# Patient Record
Sex: Female | Born: 1970 | Race: White | Hispanic: No | Marital: Married | Smoking: Never smoker
Health system: Southern US, Community
[De-identification: ages and names within clinical notes are randomized; demographics above are authoritative.]

## PROBLEM LIST (undated history)

## (undated) DIAGNOSIS — F419 Anxiety disorder, unspecified: Secondary | ICD-10-CM

## (undated) DIAGNOSIS — E785 Hyperlipidemia, unspecified: Secondary | ICD-10-CM

## (undated) DIAGNOSIS — I10 Essential (primary) hypertension: Secondary | ICD-10-CM

## (undated) HISTORY — DX: Hyperlipidemia, unspecified: E78.5

## (undated) HISTORY — DX: Essential (primary) hypertension: I10

## (undated) HISTORY — DX: Anxiety disorder, unspecified: F41.9

## (undated) HISTORY — PX: WISDOM TOOTH EXTRACTION: SHX21

---

## 2014-09-06 ENCOUNTER — Other Ambulatory Visit: Payer: Self-pay

## 2014-09-06 DIAGNOSIS — Z1231 Encounter for screening mammogram for malignant neoplasm of breast: Secondary | ICD-10-CM

## 2014-09-14 ENCOUNTER — Ambulatory Visit
Admission: RE | Admit: 2014-09-14 | Discharge: 2014-09-14 | Disposition: A | Discharge status: Home / Self Care | Payer: BC Managed Care – PPO | Source: Ambulatory Visit

## 2014-09-14 DIAGNOSIS — Z1231 Encounter for screening mammogram for malignant neoplasm of breast: Secondary | ICD-10-CM

## 2014-09-30 ENCOUNTER — Other Ambulatory Visit: Payer: Self-pay | Admitting: Physician Assistant

## 2014-09-30 DIAGNOSIS — R928 Other abnormal and inconclusive findings on diagnostic imaging of breast: Secondary | ICD-10-CM

## 2014-10-05 ENCOUNTER — Other Ambulatory Visit: Payer: Self-pay | Admitting: Physician Assistant

## 2014-10-05 ENCOUNTER — Other Ambulatory Visit: Payer: Self-pay

## 2014-10-05 DIAGNOSIS — R928 Other abnormal and inconclusive findings on diagnostic imaging of breast: Secondary | ICD-10-CM

## 2014-10-11 ENCOUNTER — Ambulatory Visit
Admission: RE | Admit: 2014-10-11 | Discharge: 2014-10-11 | Disposition: A | Discharge status: Home / Self Care | Payer: BC Managed Care – PPO | Source: Ambulatory Visit | Attending: Physician Assistant | Admitting: Physician Assistant

## 2014-10-11 DIAGNOSIS — R928 Other abnormal and inconclusive findings on diagnostic imaging of breast: Secondary | ICD-10-CM

## 2015-01-07 ENCOUNTER — Other Ambulatory Visit: Payer: Self-pay | Admitting: Physician Assistant

## 2015-08-20 ENCOUNTER — Emergency Department (HOSPITAL_COMMUNITY)
Admission: EM | Admit: 2015-08-20 | Discharge: 2015-08-20 | Disposition: A | Discharge status: Home / Self Care | Payer: BLUE CROSS/BLUE SHIELD | Attending: Emergency Medicine | Admitting: Emergency Medicine

## 2015-08-20 ENCOUNTER — Encounter (HOSPITAL_COMMUNITY): Payer: Self-pay | Admitting: Emergency Medicine

## 2015-08-20 DIAGNOSIS — Y9289 Other specified places as the place of occurrence of the external cause: Secondary | ICD-10-CM | POA: Insufficient documentation

## 2015-08-20 DIAGNOSIS — Z23 Encounter for immunization: Secondary | ICD-10-CM | POA: Diagnosis not present

## 2015-08-20 DIAGNOSIS — T1502XA Foreign body in cornea, left eye, initial encounter: Secondary | ICD-10-CM | POA: Diagnosis present

## 2015-08-20 DIAGNOSIS — Y998 Other external cause status: Secondary | ICD-10-CM | POA: Diagnosis not present

## 2015-08-20 DIAGNOSIS — X58XXXA Exposure to other specified factors, initial encounter: Secondary | ICD-10-CM | POA: Insufficient documentation

## 2015-08-20 DIAGNOSIS — Y9389 Activity, other specified: Secondary | ICD-10-CM | POA: Diagnosis not present

## 2015-08-20 DIAGNOSIS — T1592XA Foreign body on external eye, part unspecified, left eye, initial encounter: Secondary | ICD-10-CM

## 2015-08-20 MED ORDER — POLYMYXIN B-TRIMETHOPRIM 10000-0.1 UNIT/ML-% OP SOLN
1.0000 [drp] | OPHTHALMIC | Status: DC
  Administered 2015-08-20: 1 [drp] via OPHTHALMIC
  Filled 2015-08-20: qty 10

## 2015-08-20 MED ORDER — TETRACAINE HCL 0.5 % OP SOLN
1.0000 [drp] | Freq: Once | OPHTHALMIC | Status: AC
  Administered 2015-08-20: 1 [drp] via OPHTHALMIC
  Filled 2015-08-20: qty 2

## 2015-08-20 MED ORDER — TETANUS-DIPHTH-ACELL PERTUSSIS 5-2.5-18.5 LF-MCG/0.5 IM SUSP
0.5000 mL | Freq: Once | INTRAMUSCULAR | Status: AC
  Administered 2015-08-20: 0.5 mL via INTRAMUSCULAR
  Filled 2015-08-20: qty 0.5

## 2015-08-20 MED ORDER — FLUORESCEIN SODIUM 1 MG OP STRP
1.0000 | ORAL_STRIP | Freq: Once | OPHTHALMIC | Status: AC
  Administered 2015-08-20: 1 via OPHTHALMIC
  Filled 2015-08-20: qty 1

## 2015-08-20 NOTE — ED Notes (Signed)
Pt. reports foreign object at left eye this evening , mild pain / no blurred vision or drainage .

## 2015-08-20 NOTE — ED Provider Notes (Signed)
CSN: [id number]     Arrival date & time 08/20/15  1919 History  This chart was scribed for non-physician provider Roxy Horseman, PA-C, working with Laurence Spates, MD by Phillis Haggis, ED Scribe. This patient was seen in room TR04C/TR04C and patient care was started at 8:24 PM.    Chief Complaint  Patient presents with  . Foreign Body in Eye   The history is provided by the patient. No language interpreter was used.  HPI Comments: Claudia Jones is a 44 y.o. female who presents to the Emergency Department complaining of foreign body in left eye onset earlier this evening. Pt states that she was working outside, picking up and burning brush. States that she went inside and began to notice discomfort in her eye and saw a "spot" on her eye. Reports worsening pain. She states that she has tried rubbing the eye to move the foreign body to no relief. Pt states that she was wearing her glasses at the time of the incident. Denies having an eye doctor, drainage or blurred vision.  Pt is not UTD on tdap.  History reviewed. No pertinent past medical history. History reviewed. No pertinent past surgical history. No family history on file. Social History  Substance Use Topics  . Smoking status: Never Smoker   . Smokeless tobacco: None  . Alcohol Use: No   OB History    No data available     Review of Systems  Eyes: Positive for pain. Negative for discharge and visual disturbance.   Allergies  Review of patient's allergies indicates no known allergies.  Home Medications   Prior to Admission medications   Not on File   BP 140/80 mmHg  Pulse 102  Temp(Src) 99.1 F (37.3 C) (Oral)  Resp 20  Ht 5' 5.5" (1.664 m)  Wt 154 lb (69.854 kg)  BMI 25.23 kg/m2  SpO2 96%  LMP 08/06/2015  Physical Exam  Constitutional: She is oriented to person, place, and time. She appears well-developed and well-nourished. No distress.  HENT:  Head: Normocephalic and atraumatic.  Mouth/Throat:  Oropharynx is clear and moist.  Eyes: Conjunctivae and EOM are normal. Pupils are equal, round, and reactive to light.  Foreign body in left eye, mild surrounding fluorescein uptake, otherwise normal examination of the left eye  Neck: Normal range of motion. Neck supple.  Cardiovascular: Normal rate, regular rhythm and normal heart sounds.  Exam reveals no gallop and no friction rub.   No murmur heard. Pulmonary/Chest: Effort normal and breath sounds normal. No respiratory distress. She has no wheezes. She has no rales. She exhibits no tenderness.  Abdominal: Soft. Bowel sounds are normal. She exhibits no distension and no mass. There is no tenderness. There is no rebound and no guarding.  Musculoskeletal: Normal range of motion. She exhibits no edema or tenderness.  Neurological: She is alert and oriented to person, place, and time. No sensory deficit.  Skin: Skin is warm and dry.  Psychiatric: She has a normal mood and affect. Her behavior is normal. Judgment and thought content normal.  Nursing note and vitals reviewed.   ED Course  FOREIGN BODY REMOVAL Date/Time: 08/20/2015 9:10 PM Performed by: Roxy Horseman Authorized by: Roxy Horseman Consent: Verbal consent obtained. Risks and benefits: risks, benefits and alternatives were discussed Consent given by: patient Patient understanding: patient states understanding of the procedure being performed Patient consent: the patient's understanding of the procedure matches consent given Procedure consent: procedure consent matches procedure scheduled Relevant documents: relevant documents  present and verified Test results: test results available and properly labeled Site marked: the operative site was marked Imaging studies: imaging studies available Required items: required blood products, implants, devices, and special equipment available Patient identity confirmed: verbally with patient Time out: Immediately prior to procedure a  "time out" was called to verify the correct patient, procedure, equipment, support staff and site/side marked as required. Body area: eye Location details: left cornea Patient sedated: no Patient restrained: no Patient cooperative: yes Localization method: slit lamp Removal mechanism: 27-gauge needle Eye examined with fluorescein. Fluorescein uptake. Corneal abrasion size: small Corneal abrasion location: central No residual rust ring present. Dressing: antibiotic drops Depth: embedded Complexity: simple 1 objects recovered. Objects recovered: speck of dirt or wood or ash Post-procedure assessment: foreign body removed Patient tolerance: Patient tolerated the procedure well with no immediate complications   (including critical care time) DIAGNOSTIC STUDIES: Oxygen Saturation is 96% on RA, normal by my interpretation.    COORDINATION OF CARE: 8:26 PM-Discussed treatment plan which includes eye numbing drops, dye, removal of foreign body, antibiotic drops, updating tdap and follow up with opthalmologist with pt at bedside and pt agreed to plan.   Labs Review Labs Reviewed - No data to display  Imaging Review No results found.    EKG Interpretation None      MDM   Final diagnoses:  Foreign body in eye, left, initial encounter   Patient with foreign body in left eye. Foreign body removed in the emergency department. If there is still a small defect in the cornea, will recommend the patient follow-up with ophthalmology tomorrow. Tetanus shot updated. Patient given Polytrim drops. Patient is stable and ready for discharge.   Roxy Horseman, PA-C 08/20/15 2111  Laurence Spates, MD 08/20/15 [id number]

## 2015-08-20 NOTE — Discharge Instructions (Signed)
Corneal Abrasion °The cornea is the clear covering at the front and center of the eye. When looking at the colored portion of the eye (iris), you are looking through the cornea. This very thin tissue is made up of many layers. The surface layer is a single layer of cells (corneal epithelium) and is one of the most sensitive tissues in the body. If a scratch or injury causes the corneal epithelium to come off, it is called a corneal abrasion. If the injury extends to the tissues below the epithelium, the condition is called a corneal ulcer. °CAUSES  °· Scratches. °· Trauma. °· Foreign body in the eye. °Some people have recurrences of abrasions in the area of the original injury even after it has healed (recurrent erosion syndrome). Recurrent erosion syndrome generally improves and goes away with time. °SYMPTOMS  °· Eye pain. °· Difficulty or inability to keep the injured eye open. °· The eye becomes very sensitive to light. °· Recurrent erosions tend to happen suddenly, first thing in the morning, usually after waking up and opening the eye. °DIAGNOSIS  °Your health care provider can diagnose a corneal abrasion during an eye exam. Dye is usually placed in the eye using a drop or a small paper strip moistened by your tears. When the eye is examined with a special light, the abrasion shows up clearly because of the dye. °TREATMENT  °· Small abrasions may be treated with antibiotic drops or ointment alone. °· A pressure patch may be put over the eye. If this is done, follow your doctor's instructions for when to remove the patch. Do not drive or use machines while the eye patch is on. Judging distances is hard to do with a patch on. °If the abrasion becomes infected and spreads to the deeper tissues of the cornea, a corneal ulcer can result. This is serious because it can cause corneal scarring. Corneal scars interfere with light passing through the cornea and cause a loss of vision in the involved eye. °HOME CARE  INSTRUCTIONS °· Use medicine or ointment as directed. Only take over-the-counter or prescription medicines for pain, discomfort, or fever as directed by your health care provider. °· Do not drive or operate machinery if your eye is patched. Your ability to judge distances is impaired. °· If your health care provider has given you a follow-up appointment, it is very important to keep that appointment. Not keeping the appointment could result in a severe eye infection or permanent loss of vision. If there is any problem keeping the appointment, let your health care provider know. °SEEK MEDICAL CARE IF:  °· You have pain, light sensitivity, and a scratchy feeling in one eye or both eyes. °· Your pressure patch keeps loosening up, and you can blink your eye under the patch after treatment. °· Any kind of discharge develops from the eye after treatment or if the lids stick together in the morning. °· You have the same symptoms in the morning as you did with the original abrasion days, weeks, or months after the abrasion healed. °MAKE SURE YOU:  °· Understand these instructions. °· Will watch your condition. °· Will get help right away if you are not doing well or get worse. °Document Released: 12/06/2000 Document Revised: 12/14/2013 Document Reviewed: 08/16/2013 °ExitCare® Patient Information ©2015 [employer]. This information is not intended to replace advice given to you by your health care provider. Make sure you discuss any questions you have with your health care provider. ° °Eye, Foreign   Body °The term foreign body refers to any object near, on the surface of or in the eye that should not be there. A foreign body may be a small speck of dirt or dust, a hair or eyelash, a splinter or any object. °CAUSES  °Foreign bodies can get in the eye by: °· Flying pieces of something that was broken or destroyed (debris). °· A sudden injury (trauma) to the eye. °SYMPTOMS  °Symptoms depend on what the foreign body is and  where it is in the eye. The most common locations are: °· On the inner surface of the upper or lower eyelids or on the covering of the white part of the eye (conjunctiva). Symptoms in this location are: °¨ Irritating and painful, especially when blinking. °¨ Feeling like something is in the eye. °· On the surface of the clear covering on the front of the eye (cornea). A corneal foreign body has symptoms that: °¨ Are painful and irritating since the cornea is very sensitive. °¨ Form small "rust rings" around a metallic foreign body. Metallic foreign bodies stick more firmly to the surface of the cornea. °· Inside the eyeball. Infection can happen fast and can be hard to treat with antibiotics. This is an extremely dangerous situation. Foreign bodies inside the eye can threaten vision. A person may even loose their eye. Foreign bodies inside the eye may cause: °¨ Great pain. °¨ Immediate loss of vision. °DIAGNOSIS  °Foreign bodies are found during an exam by an eye specialist. Those that are on the eyelids, conjunctiva or cornea are usually (but not always) easily found. When a foreign body is inside the eyeball, a cataract may form almost right away. This makes it hard for an ophthalmologist to find the foreign body. Special tests may be needed, including ultrasound testing, X-rays and CT scans. °TREATMENT  °· Foreign bodies that are on the eyelids, conjunctiva or cornea are often removed easily and painlessly. °· If the foreign body has caused a scratch or abrasion of the cornea, antibiotic drops, ointments and/or a tight patch called a "pressure patch" may be needed. Follow-up exams will be needed for several days until the abrasion heals. °· Surgery is needed right away if the foreign body is inside the eyeball. This is a medical emergency. An antibiotic therapy will likely be given to stop an infection. °HOME CARE INSTRUCTIONS  °The use of eye patches is not universal. Their use varies from state to state and  from caregiver to caregiver. °If an eye patch was applied: °· Keep the eye patch on for as long as directed by your caregiver until the follow-up appointment. °· Do not remove the patch to put in medications unless instructed to do so. When replacing the patch, retape it as it was before. Follow the same procedure if the patch becomes loose. °· WARNING: Do not drive or operate machinery while the eye is patched. The ability to judge distances will be impaired. °· Only take over-the-counter or prescription medicines for pain, discomfort or fever as directed by the caregiver. °If no eye patch was applied: °· Keep the eye closed as much as possible. Do not rub the eye. °· Wear dark glasses as needed to protect the eyes from bright light. °· Do not wear contact lenses until the eye feels normal again, or as instructed. °· Wear protective eye covering if there is a risk of eye injury. This is important when working with high speed tools. °· Only take over-the-counter or prescription   medicines for pain, discomfort or fever as directed by the caregiver. °SEEK IMMEDIATE MEDICAL CARE IF:  °· Pain increases in the eye or the vision changes. °· You or your child has problems with the eye patch. °· The injury to the eye appears to be getting larger. °· There is discharge from the injured eye. °· Swelling and/or soreness (inflammation) develops around the affected eye. °· You or your child has an oral temperature above 102° F (38.9° C), not controlled by medicine. °· Your baby is older than 3 months with a rectal temperature of 102° F (38.9° C) or higher. °· Your baby is 3 months old or younger with a rectal temperature of 100.4° F (38° C) or higher. °MAKE SURE YOU:  °· Understand these instructions. °· Will watch your condition. °· Will get help right away if you are not doing well or get worse. °Document Released: 12/09/2005 Document Revised: 03/02/2012 Document Reviewed: 05/06/2013 °ExitCare® Patient Information ©2015  [employer]. This information is not intended to replace advice given to you by your health care provider. Make sure you discuss any questions you have with your health care provider. ° °

## 2015-11-30 ENCOUNTER — Other Ambulatory Visit: Payer: Self-pay

## 2015-11-30 DIAGNOSIS — Z1231 Encounter for screening mammogram for malignant neoplasm of breast: Secondary | ICD-10-CM

## 2015-12-28 ENCOUNTER — Ambulatory Visit
Admission: RE | Admit: 2015-12-28 | Discharge: 2015-12-28 | Disposition: A | Discharge status: Home / Self Care | Payer: BLUE CROSS/BLUE SHIELD | Source: Ambulatory Visit

## 2015-12-28 DIAGNOSIS — Z1231 Encounter for screening mammogram for malignant neoplasm of breast: Secondary | ICD-10-CM

## 2016-11-21 ENCOUNTER — Other Ambulatory Visit: Payer: Self-pay | Admitting: Physician Assistant

## 2016-11-21 DIAGNOSIS — Z1231 Encounter for screening mammogram for malignant neoplasm of breast: Secondary | ICD-10-CM

## 2016-12-30 ENCOUNTER — Ambulatory Visit
Admission: RE | Admit: 2016-12-30 | Discharge: 2016-12-30 | Disposition: A | Discharge status: Home / Self Care | Payer: BLUE CROSS/BLUE SHIELD | Source: Ambulatory Visit | Attending: Physician Assistant | Admitting: Physician Assistant

## 2016-12-30 DIAGNOSIS — Z1231 Encounter for screening mammogram for malignant neoplasm of breast: Secondary | ICD-10-CM

## 2018-11-12 ENCOUNTER — Ambulatory Visit: Payer: BLUE CROSS/BLUE SHIELD | Admitting: Clinical

## 2019-08-27 ENCOUNTER — Telehealth: Payer: Self-pay

## 2019-08-27 NOTE — Telephone Encounter (Signed)
NOTES ON FILE FROM [facility] [phone], SENT REFERRAL TO SCHEDULING °

## 2019-10-13 ENCOUNTER — Ambulatory Visit: Payer: BLUE CROSS/BLUE SHIELD | Admitting: Family Medicine

## 2019-10-18 ENCOUNTER — Ambulatory Visit: Payer: BC Managed Care – PPO | Admitting: Cardiovascular Disease

## 2021-01-15 ENCOUNTER — Ambulatory Visit: Payer: BC Managed Care – PPO | Admitting: Psychology

## 2021-01-24 ENCOUNTER — Ambulatory Visit (INDEPENDENT_AMBULATORY_CARE_PROVIDER_SITE_OTHER): Payer: BC Managed Care – PPO | Admitting: Psychology

## 2021-01-24 DIAGNOSIS — F411 Generalized anxiety disorder: Secondary | ICD-10-CM

## 2021-02-07 ENCOUNTER — Ambulatory Visit (INDEPENDENT_AMBULATORY_CARE_PROVIDER_SITE_OTHER): Payer: PRIVATE HEALTH INSURANCE | Admitting: Psychology

## 2021-02-07 DIAGNOSIS — F411 Generalized anxiety disorder: Secondary | ICD-10-CM | POA: Diagnosis not present

## 2021-02-21 ENCOUNTER — Ambulatory Visit (INDEPENDENT_AMBULATORY_CARE_PROVIDER_SITE_OTHER): Payer: PRIVATE HEALTH INSURANCE | Admitting: Psychology

## 2021-02-21 DIAGNOSIS — F411 Generalized anxiety disorder: Secondary | ICD-10-CM

## 2021-02-27 ENCOUNTER — Telehealth: Payer: Self-pay

## 2021-02-27 NOTE — Telephone Encounter (Signed)
NOTES ON FILE FROM  [facility] [phone], SENT REFERRAL TO SCHEDULING

## 2021-03-20 ENCOUNTER — Encounter: Payer: Self-pay | Admitting: Cardiovascular Disease

## 2021-03-20 ENCOUNTER — Ambulatory Visit (INDEPENDENT_AMBULATORY_CARE_PROVIDER_SITE_OTHER): Payer: BC Managed Care – PPO | Admitting: Psychology

## 2021-03-20 DIAGNOSIS — F411 Generalized anxiety disorder: Secondary | ICD-10-CM | POA: Diagnosis not present

## 2021-03-20 NOTE — Progress Notes (Signed)
Chief Complaint  Patient presents with  . New Patient (Initial Visit)    Fatigue   History of Present Illness: 50 yo female with history of HTN, hyperlipidemia, anxiety and borderline DM who is here today as a new patient for the evaluation of fatigue. 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 No prior heart issues. Her father had an MI at age 52.  She has been concerned about her family history. She is feeling well overall. Occasional chest pains. No dyspnea. She rarely exercises.   Primary Care Provider: Donnelly Stager, PA-C  Past Medical History:  Diagnosis Date  . Anxiety   . HTN (hypertension)   . Hyperlipidemia     Past Surgical History:  Procedure Laterality Date  . WISDOM TOOTH EXTRACTION      Current Outpatient Medications  Medication Sig Dispense Refill  . DULoxetine (CYMBALTA) 60 MG capsule Take 30 mg by mouth daily.    Marland Kitchen estradiol (ESTRACE) 0.1 MG/GM vaginal cream Place 0.1 g vaginally in the morning and at bedtime.    . nebivolol (BYSTOLIC) 2.5 MG tablet Take 1.25 mg by mouth in the morning and at bedtime.    . rosuvastatin (CRESTOR) 20 MG tablet Take 1 tablet by mouth at bedtime.     No current facility-administered medications for this visit.    No Known Allergies  Social History   Socioeconomic History  . Marital status: Married    Spouse name: Not on file  . Number of children: 1  . Years of education: Not on file  . Highest education level: Not on file  Occupational History  . Occupation: She is a Quarry manager  Tobacco Use  . Smoking status: Never Smoker  . Smokeless tobacco: Never Used  Substance and Sexual Activity  . Alcohol use: No  . Drug use: No  . Sexual activity: Not on file  Other Topics Concern  . Not on file  Social History Narrative  . Not on file   Social Determinants of Health   Financial Resource Strain: Not on file  Food Insecurity: Not on file  Transportation Needs: Not on file  Physical  Activity: Not on file  Stress: Not on file  Social Connections: Not on file  Intimate Partner Violence: Not on file    Family History  Problem Relation Age of Onset  . Heart attack Father 43  . Atrial fibrillation Father   . Atrial fibrillation Sister   . Heart attack Maternal Grandmother 55    Review of Systems:  As stated in the HPI and otherwise negative.   BP 120/68   Pulse (!) 52   Ht 5' 5.5" (1.664 m)   Wt 160 lb 12.8 oz (72.9 kg)   SpO2 98%   BMI 26.35 kg/m   Physical Examination: General: Well developed, well nourished, NAD  HEENT: OP clear, mucus membranes moist  SKIN: warm, dry. No rashes. Neuro: No focal deficits  Musculoskeletal: Muscle strength 5/5 all ext  Psychiatric: Mood and affect normal  Neck: No JVD, no carotid bruits, no thyromegaly, no lymphadenopathy.  Lungs:Clear bilaterally, no wheezes, rhonci, crackles Cardiovascular: Regular rate and rhythm. No murmurs, gallops or rubs. Abdomen:Soft. Bowel sounds present. Non-tender.  Extremities: No lower extremity edema. Pulses are 2 + in the bilateral DP/PT.  EKG:  EKG is ordered today. The ekg ordered today demonstrates Sinus bradycardia, rate 52 bpm  Recent Labs: No results found for requested labs within last 8760 hours.   Lipid Panel No  results found for: CHOL, TRIG, HDL, CHOLHDL, VLDL, LDLCALC, LDLDIRECT   Wt Readings from Last 3 Encounters:  03/21/21 160 lb 12.8 oz (72.9 kg)  08/20/15 154 lb (69.9 kg)    Assessment and Plan:   1. Fatigue 2. Family history of CAD  Will arrange en echo to assess LV systolic function and exclude structural heart disease. Coronary calcium scoring CT to assess future risk.   Current medicines are reviewed at length with the patient today.  The patient does not have concerns regarding medicines.  The following changes have been made:  no change  Labs/ tests ordered today include:   Orders Placed This Encounter  Procedures  . CT CARDIAC SCORING (SELF PAY  ONLY)  . EKG 12-Lead  . ECHOCARDIOGRAM COMPLETE     Disposition:   FU with me in 12 months.    Signed, Verne Carrow, MD 03/21/2021 9:25 AM    [facility] [address] [place], [place], [place]  [address] Phone: [phone]; Fax: [phone]

## 2021-03-21 ENCOUNTER — Ambulatory Visit: Payer: BC Managed Care – PPO | Admitting: Cardiovascular Disease

## 2021-03-21 ENCOUNTER — Other Ambulatory Visit: Payer: Self-pay

## 2021-03-21 ENCOUNTER — Encounter: Payer: Self-pay | Admitting: Cardiovascular Disease

## 2021-03-21 VITALS — BP 120/68 | HR 52 | Ht 65.5 in | Wt 160.8 lb

## 2021-03-21 DIAGNOSIS — R5383 Other fatigue: Secondary | ICD-10-CM | POA: Diagnosis not present

## 2021-03-21 NOTE — Patient Instructions (Signed)
Medication Instructions:  No changes *If you need a refill on your cardiac medications before your next appointment, please call your pharmacy*   Lab Work: none If you have labs (blood work) drawn today and your tests are completely normal, you will receive your results only by: Marland Kitchen MyChart Message (if you have MyChart) OR . A paper copy in the mail If you have any lab test that is abnormal or we need to change your treatment, we will call you to review the results.   Testing/Procedures: Your physician has requested that you have an echocardiogram. Echocardiography is a painless test that uses sound waves to create images of your heart. It provides your doctor with information about the size and shape of your heart and how well your heart's chambers and valves are working. This procedure takes approximately one hour. There are no restrictions for this procedure.  Calcium Score ct scan  Follow-Up: At [facility], you and your health needs are our priority.  As part of our continuing mission to provide you with exceptional heart care, we have created designated Provider Care Teams.  These Care Teams include your primary Cardiologist (physician) and Advanced Practice Providers (APPs -  Physician Assistants and Nurse Practitioners) who all work together to provide you with the care you need, when you need it.   Your next appointment:   12 month(s)  The format for your next appointment:   In Person  Provider:   You may see Verne Carrow, MD or one of the following Advanced Practice Providers on your designated Care Team:    Ronie Spies, PA-C  Jacolyn Reedy, PA-C   Other Instructions

## 2021-03-28 ENCOUNTER — Telehealth: Payer: Self-pay | Admitting: Cardiovascular Disease

## 2021-03-28 NOTE — Telephone Encounter (Signed)
Returned call to patient and advised that calcium scoring has not ever been covered by insurance, however I gave her CPT code and diagnosis code per her request. She thanked me for the call.

## 2021-03-28 NOTE — Telephone Encounter (Signed)
Patient wanted to get the CPT code  and Dx code for her calcium scoring. She needs them for her insurance to see if her insurance will cover it

## 2021-04-04 ENCOUNTER — Ambulatory Visit: Payer: BC Managed Care – PPO | Admitting: Psychology

## 2021-04-18 ENCOUNTER — Ambulatory Visit: Payer: BC Managed Care – PPO | Admitting: Psychology

## 2021-04-20 ENCOUNTER — Other Ambulatory Visit: Payer: Self-pay

## 2021-04-20 ENCOUNTER — Ambulatory Visit (INDEPENDENT_AMBULATORY_CARE_PROVIDER_SITE_OTHER)
Admission: RE | Admit: 2021-04-20 | Discharge: 2021-04-20 | Disposition: A | Discharge status: Home / Self Care | Payer: Self-pay | Source: Ambulatory Visit | Attending: Cardiovascular Disease | Admitting: Cardiovascular Disease

## 2021-04-20 ENCOUNTER — Ambulatory Visit (HOSPITAL_COMMUNITY): Payer: BC Managed Care – PPO | Attending: Cardiovascular Disease

## 2021-04-20 DIAGNOSIS — R5383 Other fatigue: Secondary | ICD-10-CM

## 2021-04-20 LAB — ECHOCARDIOGRAM COMPLETE
Area-P 1/2: 3.26 cm2
S' Lateral: 2.95 cm

## 2021-04-23 ENCOUNTER — Telehealth: Payer: Self-pay

## 2021-04-23 NOTE — Telephone Encounter (Addendum)
Gave pt her Cardiac Score results but she is asking about the part of the report that shows:    FINDINGS: Several densely calcified left hilar lymph nodes are incidentally noted.   Spoke with Dr. Clifton James and this is not an "acute" but incidental finding that may suggest a previous infection. No further testing but will forward to her PCP.   LM for the pt to call back.   Will forward to triage.

## 2021-04-23 NOTE — Telephone Encounter (Signed)
-----   Message from Kathleene Hazel, MD sent at 04/23/2021  7:22 AM EDT ----- Calcium score of zero. This is good news. Also see echo report. cdm

## 2021-04-24 NOTE — Telephone Encounter (Signed)
Pt is calling back to get her results.Please advise

## 2021-04-24 NOTE — Telephone Encounter (Signed)
Gave Dr. Gibson Ramp review and advisement. Patient verbalized understanding.

## 2021-05-02 ENCOUNTER — Ambulatory Visit: Payer: BC Managed Care – PPO | Admitting: Psychology

## 2021-05-16 ENCOUNTER — Ambulatory Visit: Payer: BC Managed Care – PPO | Admitting: Psychology

## 2021-06-18 ENCOUNTER — Other Ambulatory Visit: Payer: Self-pay

## 2021-06-18 ENCOUNTER — Emergency Department (HOSPITAL_BASED_OUTPATIENT_CLINIC_OR_DEPARTMENT_OTHER)
Admission: EM | Admit: 2021-06-18 | Discharge: 2021-06-18 | Disposition: A | Discharge status: Home / Self Care | Payer: BC Managed Care – PPO | Attending: Emergency Medicine | Admitting: Emergency Medicine

## 2021-06-18 ENCOUNTER — Encounter (HOSPITAL_BASED_OUTPATIENT_CLINIC_OR_DEPARTMENT_OTHER): Payer: Self-pay

## 2021-06-18 DIAGNOSIS — W57XXXA Bitten or stung by nonvenomous insect and other nonvenomous arthropods, initial encounter: Secondary | ICD-10-CM

## 2021-06-18 DIAGNOSIS — Z79899 Other long term (current) drug therapy: Secondary | ICD-10-CM | POA: Insufficient documentation

## 2021-06-18 DIAGNOSIS — Y92007 Garden or yard of unspecified non-institutional (private) residence as the place of occurrence of the external cause: Secondary | ICD-10-CM | POA: Insufficient documentation

## 2021-06-18 DIAGNOSIS — W540XXA Bitten by dog, initial encounter: Secondary | ICD-10-CM | POA: Diagnosis not present

## 2021-06-18 DIAGNOSIS — Y9389 Activity, other specified: Secondary | ICD-10-CM | POA: Diagnosis not present

## 2021-06-18 DIAGNOSIS — I10 Essential (primary) hypertension: Secondary | ICD-10-CM | POA: Diagnosis not present

## 2021-06-18 DIAGNOSIS — S80861A Insect bite (nonvenomous), right lower leg, initial encounter: Secondary | ICD-10-CM | POA: Diagnosis present

## 2021-06-18 MED ORDER — CEPHALEXIN 500 MG PO CAPS: 500.0000 mg | ORAL_CAPSULE | Freq: Three times a day (TID) | ORAL | 0 refills | Status: AC

## 2021-06-18 NOTE — ED Provider Notes (Signed)
[facility] EMERGENCY DEPT Provider Note   CSN: [id number] Arrival date & time: 06/18/21  1934     History Chief Complaint  Patient presents with   Insect Bite    Claudia Jones is a 50 y.o. female.  Presents with bug bite to right lower extremity and redness surrounding it.  She states that she was working in the garden today wearing shorts felt itchy bug bite to the mid lower extremity on the right side.  She used a suction cup device to try to suction out some irritant that she typically uses for mosquito bites.  And then throughout the day she has noticed redness streaking down from the bug bite down to her lower ankle region.  Denies any fevers or cough no vomiting or diarrhea denies any pain.      Past Medical History:  Diagnosis Date   Anxiety    HTN (hypertension)    Hyperlipidemia     There are no problems to display for this patient.   Past Surgical History:  Procedure Laterality Date   WISDOM TOOTH EXTRACTION       OB History   No obstetric history on file.     Family History  Problem Relation Age of Onset   Heart attack Father 34   Atrial fibrillation Father    Atrial fibrillation Sister    Heart attack Maternal Grandmother 18    Social History   Tobacco Use   Smoking status: Never   Smokeless tobacco: Never  Substance Use Topics   Alcohol use: No   Drug use: No    Home Medications Prior to Admission medications   Medication Sig Start Date End Date Taking? Authorizing Provider  cephALEXin (KEFLEX) 500 MG capsule Take 1 capsule (500 mg total) by mouth 3 (three) times daily for 7 days. 06/18/21 06/25/21 Yes Cheryll Cockayne, MD  DULoxetine (CYMBALTA) 60 MG capsule Take 30 mg by mouth daily. 11/08/15   [provider]  estradiol (ESTRACE) 0.1 MG/GM vaginal cream Place 0.1 g vaginally in the morning and at bedtime. 01/30/21   [provider]  nebivolol (BYSTOLIC) 2.5 MG tablet Take 1.25 mg by mouth in the morning and  at bedtime.    [provider]  rosuvastatin (CRESTOR) 20 MG tablet Take 1 tablet by mouth at bedtime. 11/08/15   [provider]    Allergies    Patient has no known allergies.  Review of Systems   Review of Systems  Constitutional:  Negative for fever.  HENT:  Negative for ear pain.   Eyes:  Negative for pain.  Respiratory:  Negative for cough.   Cardiovascular:  Negative for chest pain.  Gastrointestinal:  Negative for abdominal pain.  Genitourinary:  Negative for flank pain.  Musculoskeletal:  Negative for back pain.  Skin:  Positive for rash.  Neurological:  Negative for headaches.   Physical Exam Updated Vital Signs BP 103/63 (BP Location: Right Arm)   Pulse (!) 50   Temp 98.3 F (36.8 C) (Oral)   Resp 18   Ht 5\' 5"  (1.651 m)   Wt 70.3 kg   SpO2 98%   BMI 25.79 kg/m   Physical Exam Constitutional:      General: She is not in acute distress.    Appearance: Normal appearance.  HENT:     Head: Normocephalic.     Nose: Nose normal.  Eyes:     Extraocular Movements: Extraocular movements intact.  Cardiovascular:     Rate  and Rhythm: Normal rate.  Pulmonary:     Effort: Pulmonary effort is normal.  Musculoskeletal:        General: Normal range of motion.     Cervical back: Normal range of motion.  Skin:    Comments: Erythema of the right lower extremity with lymphedema streaking from distal anterior tibia to more proximal tibia.  No abnormal warmth or fluctuance or abscess noted.  Neurological:     General: No focal deficit present.     Mental Status: She is alert. Mental status is at baseline.    ED Results / Procedures / Treatments   Labs (all labs ordered are listed, but only abnormal results are displayed) Labs Reviewed - No data to display  EKG None  Radiology No results found.  Procedures Procedures   Medications Ordered in ED Medications - No data to display  ED Course  I have reviewed the triage vital signs and the  nursing notes.  Pertinent labs & imaging results that were available during my care of the patient were reviewed by me and considered in my medical decision making (see chart for details).    MDM Rules/Calculators/A&P                          Patient presents after a bug bite today and some increased redness surrounding the bug bite.  Empirically given a prescription of Keflex.  Advised topical hydrocortisone cream.  Advise follow-up with her doctor in 2 to 3 days.  Advised immediate return for increased redness increased swelling pain purulent discharge or any additional concerns return immediately back to the ER.  Final Clinical Impression(s) / ED Diagnoses Final diagnoses:  Bug bite, initial encounter    Rx / DC Orders ED Discharge Orders          Ordered    cephALEXin (KEFLEX) 500 MG capsule  3 times daily        06/18/21 2005             Annebelle Bostic S, MD 06/18/21 2005

## 2021-06-18 NOTE — Discharge Instructions (Addendum)
Call your primary care doctor or specialist as discussed in the next 2-3 days.   Return immediately back to the ER if:  Your symptoms worsen within the next 12-24 hours. You develop new symptoms such as new fevers, persistent vomiting, new pain, shortness of breath, or new weakness or numbness, or if you have any other concerns.  

## 2021-06-18 NOTE — ED Triage Notes (Signed)
Pt reports itchy and redness to right lower leg  since yesterday and worsened today- states she thinks she was bitten by insect   Denies fever

## 2022-07-16 IMAGING — CT CT CARDIAC CORONARY ARTERY CALCIUM SCORE
3 series · 14 of 20 positions shown, 15 images · non-contrast
Comparison: None.
COMPARISON: None.

Addendum:
EXAM:
OVER-READ INTERPRETATION  CT CHEST

The following report is an over-read performed by radiologist Dr.
Rtoyota Joshjax [[email or web address]] on 04/20/2021. This
over-read does not include interpretation of cardiac or coronary
anatomy or pathology. The coronary calcium score interpretation by
the cardiologist is attached.
CLINICAL DATA: Cardiovascular Disease Risk stratification
Coronary Calcium Score
TECHNIQUE: A gated, non-contrast computed tomography scan of the heart was
performed using 3mm slice thickness. Axial images were analyzed on a
dedicated workstation. Calcium scoring of the coronary arteries was
performed using the Agatston method.

[Series 2: casc 3.0 bv41 2 bestdiast 70 % · axial · 0.33mm/px · z∈[-268,-170]mm · 4 of 57 slices shown, 5 images]
[im 12/57  vessel]
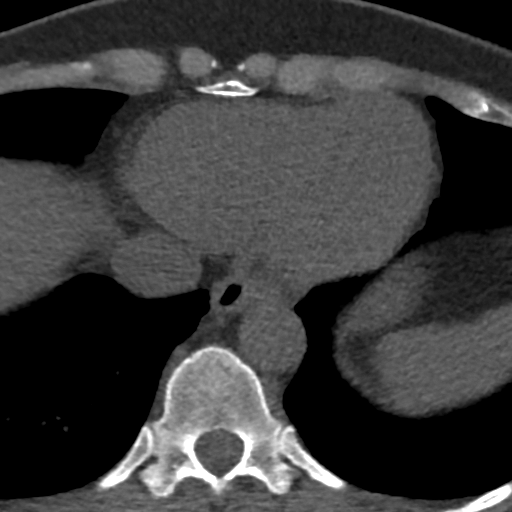
[im 12/57  lung]
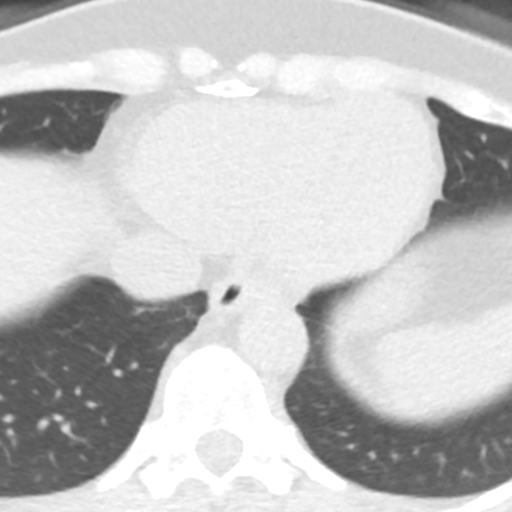
[im 23/57  vessel]
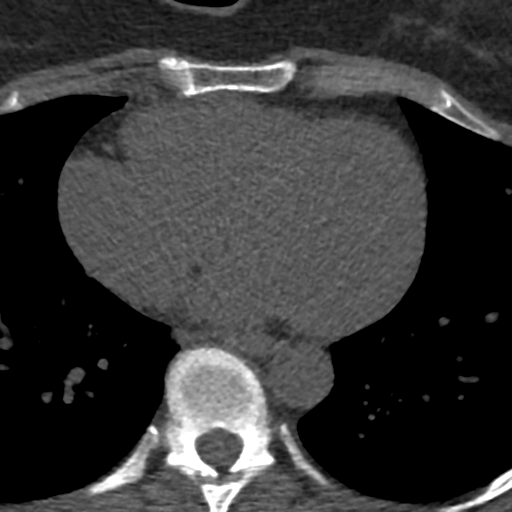
[im 34/57  vessel]
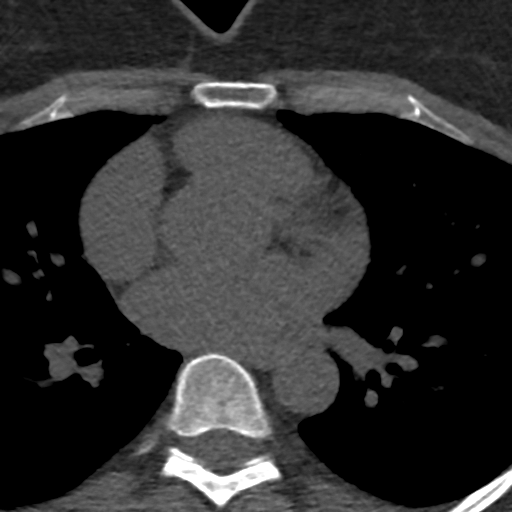
[im 45/57  vessel]
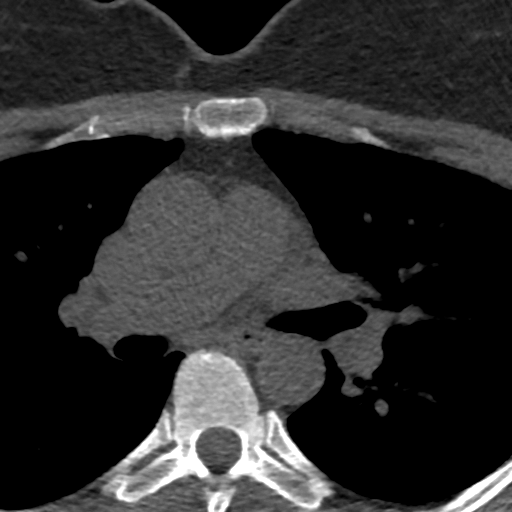

[Series 3: lung 71 % · axial · 0.68mm/px · z∈[-274,-164]mm · 5 of 57 slices shown]
[im 10/57  lung]
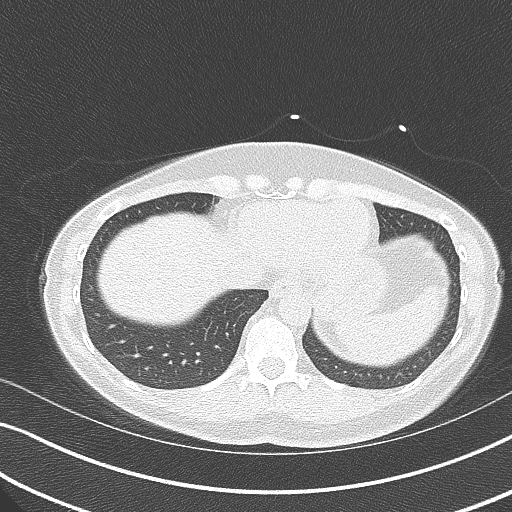
[im 19/57  lung]
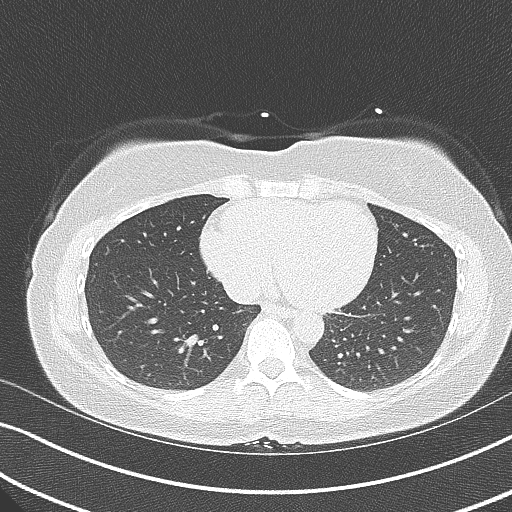
[im 29/57  lung]
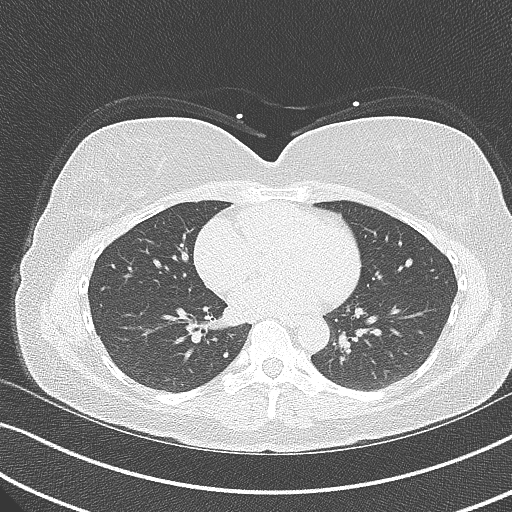
[im 38/57  lung]
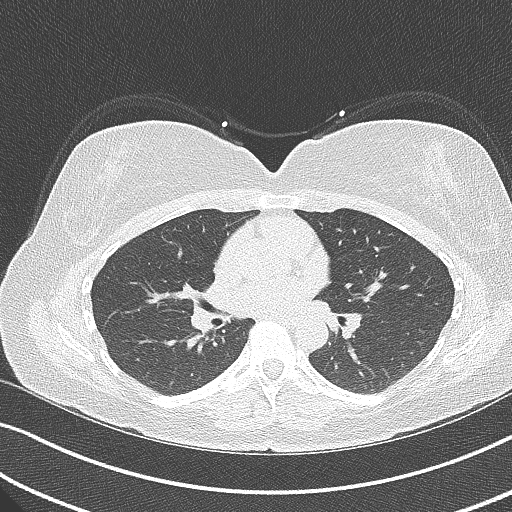
[im 47/57  lung]
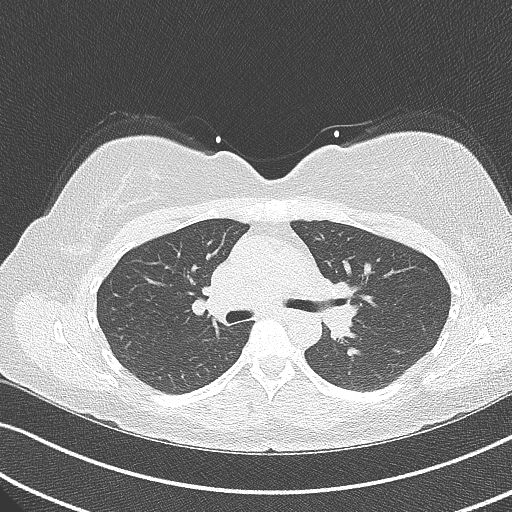

[Series 4: lung st 71 % · axial · 0.68mm/px · z∈[-274,-164]mm · 5 of 57 slices shown]
[im 10/57  lung]
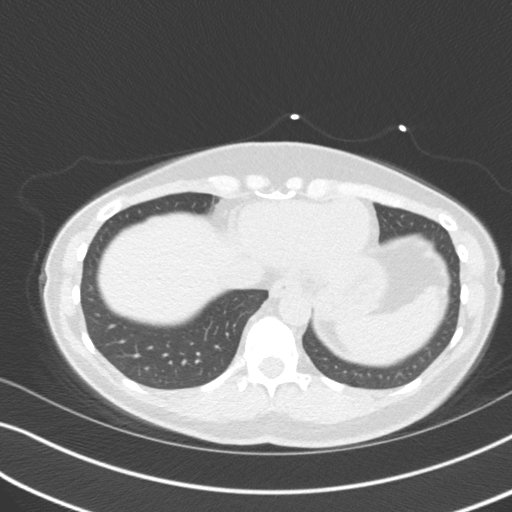
[im 19/57  lung]
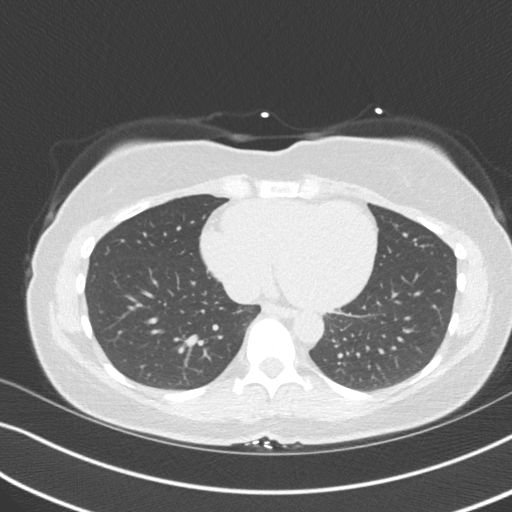
[im 29/57  lung]
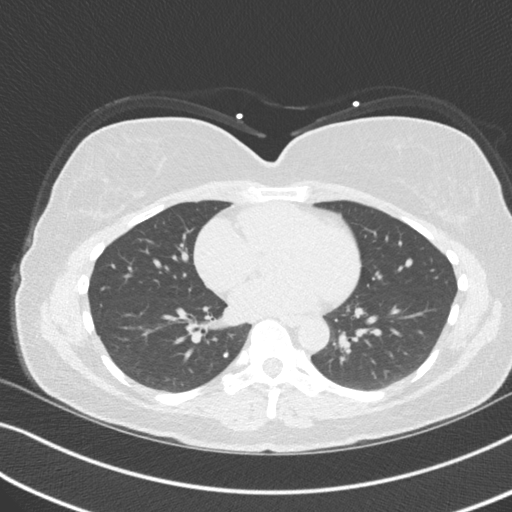
[im 38/57  lung]
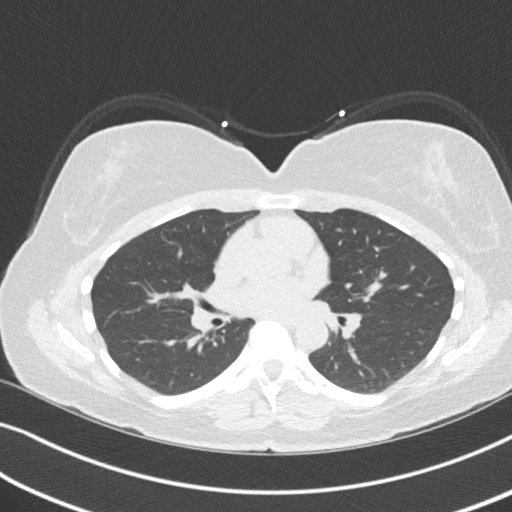
[im 47/57  lung]
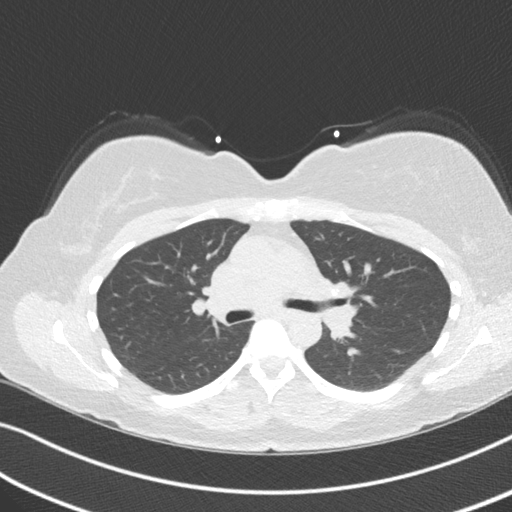

[14 of 20 positions shown; findings below may reference images not displayed]

FINDINGS: Several densely calcified left hilar lymph nodes are incidentally
noted. Small calcified granuloma in the medial aspect of the right
lower lobe. Within the visualized portions of the thorax there are
no other suspicious appearing pulmonary nodules or masses, there is
no acute consolidative airspace disease, no pleural effusions, no
pneumothorax and no lymphadenopathy. Visualized portions of the
upper abdomen are unremarkable. There are no aggressive appearing
lytic or blastic lesions noted in the visualized portions of the
skeleton.
IMPRESSION: 1. Old granulomatous disease, as above.
FINDINGS: Coronary Calcium Score:

Left main: 0

Left anterior descending artery: 0

Left circumflex artery: 0

Right coronary artery: 0

Total: 0

Percentile: NA

Pericardium: Normal.

Ascending Aorta: Normal caliber.

Non-cardiac: See separate report from [REDACTED].
IMPRESSION: Coronary calcium score of 0.



If CAC=0, it is reasonable to withhold statin therapy and reassess
in 5 to 10 years, as long as higher risk conditions are absent
(diabetes mellitus, family history of premature CHD in first degree
relatives (males <55 years; females <65 years), cigarette smoking,
or LDL >=190 mg/dL).

If CAC is 1 to 99, it is reasonable to initiate statin therapy for
patients >=55 years of age.

If CAC is >=100 or >=75th percentile, it is reasonable to initiate
statin therapy at any age.

Cardiology referral should be considered for patients with CAC
scores >=400 or >=75th percentile.

*0294 AHA/ACC/AACVPR/AAPA/ABC/AKIRA/MUSHEG/TIGER/Uriel Antonio/DOMISIN/AVISHY/SARAH
Guideline on the Management of Blood Cholesterol: A Report of the
American College of Cardiology/American Heart Association Task Force
on Clinical Practice Guidelines. J Am Coll Cardiol.
9466;73(24):9090-9139.

*** End of Addendum ***
EXAM:
OVER-READ INTERPRETATION  CT CHEST

The following report is an over-read performed by radiologist Dr.
Rtoyota Joshjax [REDACTED] on 04/20/2021. This
over-read does not include interpretation of cardiac or coronary
anatomy or pathology. The coronary calcium score interpretation by
the cardiologist is attached.
FINDINGS: Several densely calcified left hilar lymph nodes are incidentally
noted. Small calcified granuloma in the medial aspect of the right
lower lobe. Within the visualized portions of the thorax there are
no other suspicious appearing pulmonary nodules or masses, there is
no acute consolidative airspace disease, no pleural effusions, no
pneumothorax and no lymphadenopathy. Visualized portions of the
upper abdomen are unremarkable. There are no aggressive appearing
lytic or blastic lesions noted in the visualized portions of the
skeleton.
IMPRESSION: 1. Old granulomatous disease, as above.

## 2023-02-12 LAB — EXTERNAL GENERIC LAB PROCEDURE: COLOGUARD: NEGATIVE
